# Patient Record
Sex: Female | Born: 1955 | Race: White | Hispanic: No | Marital: Married | Smoking: Former smoker
Health system: Southern US, Community
[De-identification: ages and names within clinical notes are randomized; demographics above are authoritative.]

## PROBLEM LIST (undated history)

## (undated) HISTORY — PX: CHOLECYSTECTOMY: SHX55

## (undated) HISTORY — PX: TUBAL LIGATION: SHX77

---

## 2006-08-04 ENCOUNTER — Ambulatory Visit: Payer: Self-pay | Admitting: Internal Medicine

## 2006-11-20 ENCOUNTER — Ambulatory Visit: Payer: Self-pay | Admitting: Internal Medicine

## 2007-06-29 ENCOUNTER — Ambulatory Visit: Payer: Self-pay | Admitting: Family Medicine

## 2007-07-07 ENCOUNTER — Ambulatory Visit: Payer: Self-pay | Admitting: Internal Medicine

## 2007-10-12 ENCOUNTER — Ambulatory Visit: Payer: Self-pay | Admitting: General Surgery

## 2007-10-14 ENCOUNTER — Ambulatory Visit: Payer: Self-pay | Admitting: General Surgery

## 2008-04-26 ENCOUNTER — Ambulatory Visit: Payer: Self-pay | Admitting: Internal Medicine

## 2008-09-29 IMAGING — CR DG KNEE COMPLETE 4+V*L*
1 series · 4 of 4 positions shown · non-contrast
Comparison: none

REASON FOR EXAM: pain
COMMENTS:

PROCEDURE:     DXR - DXR KNEE LT COMP WITH OBLIQUES  - June 29, 2007  [DATE]
RESULT:     Four views show no fracture, dislocation or other acute bony
abnormality. There is noted mild degenerative spurring about the knee
medially.  Also noted in the lateral view is slight dorsal patella spurring.

[Series 1: view not recorded · 0.17mm/px · 4 of 4 slices shown]
[im 1/4]
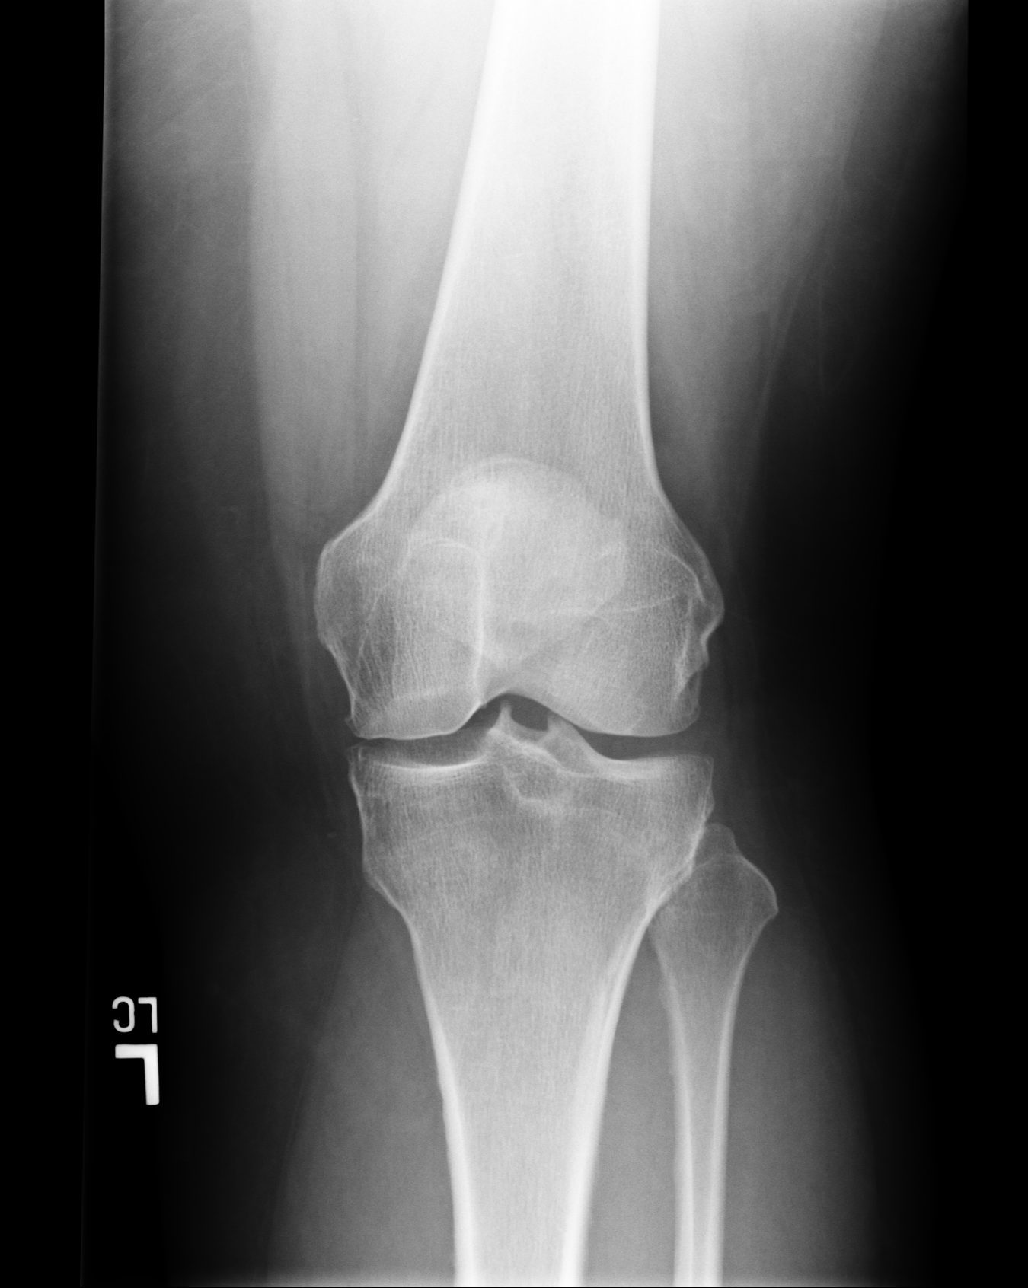
[im 2/4]
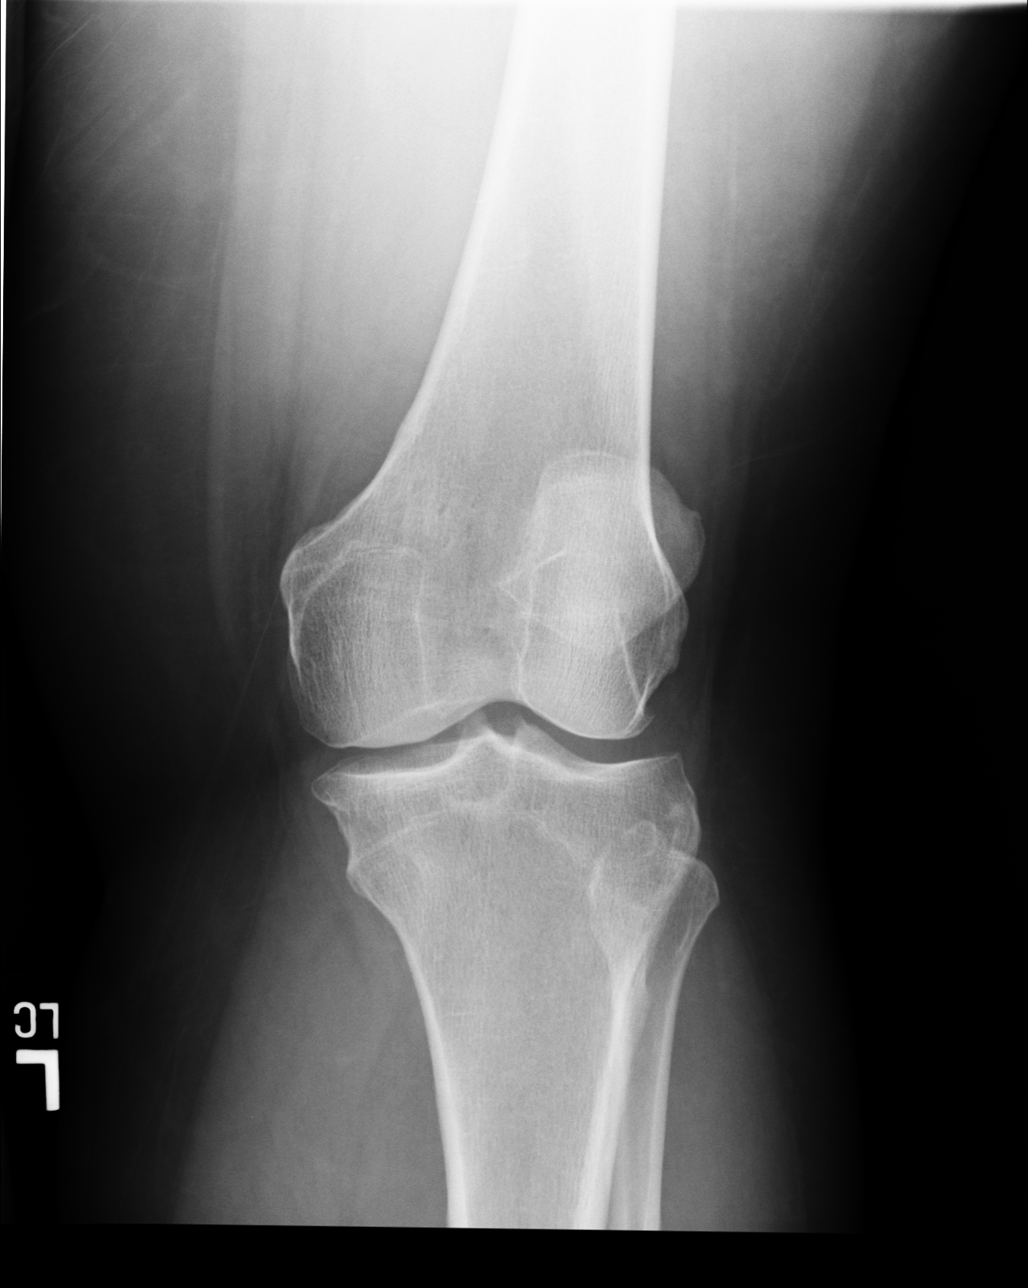
[im 3/4]
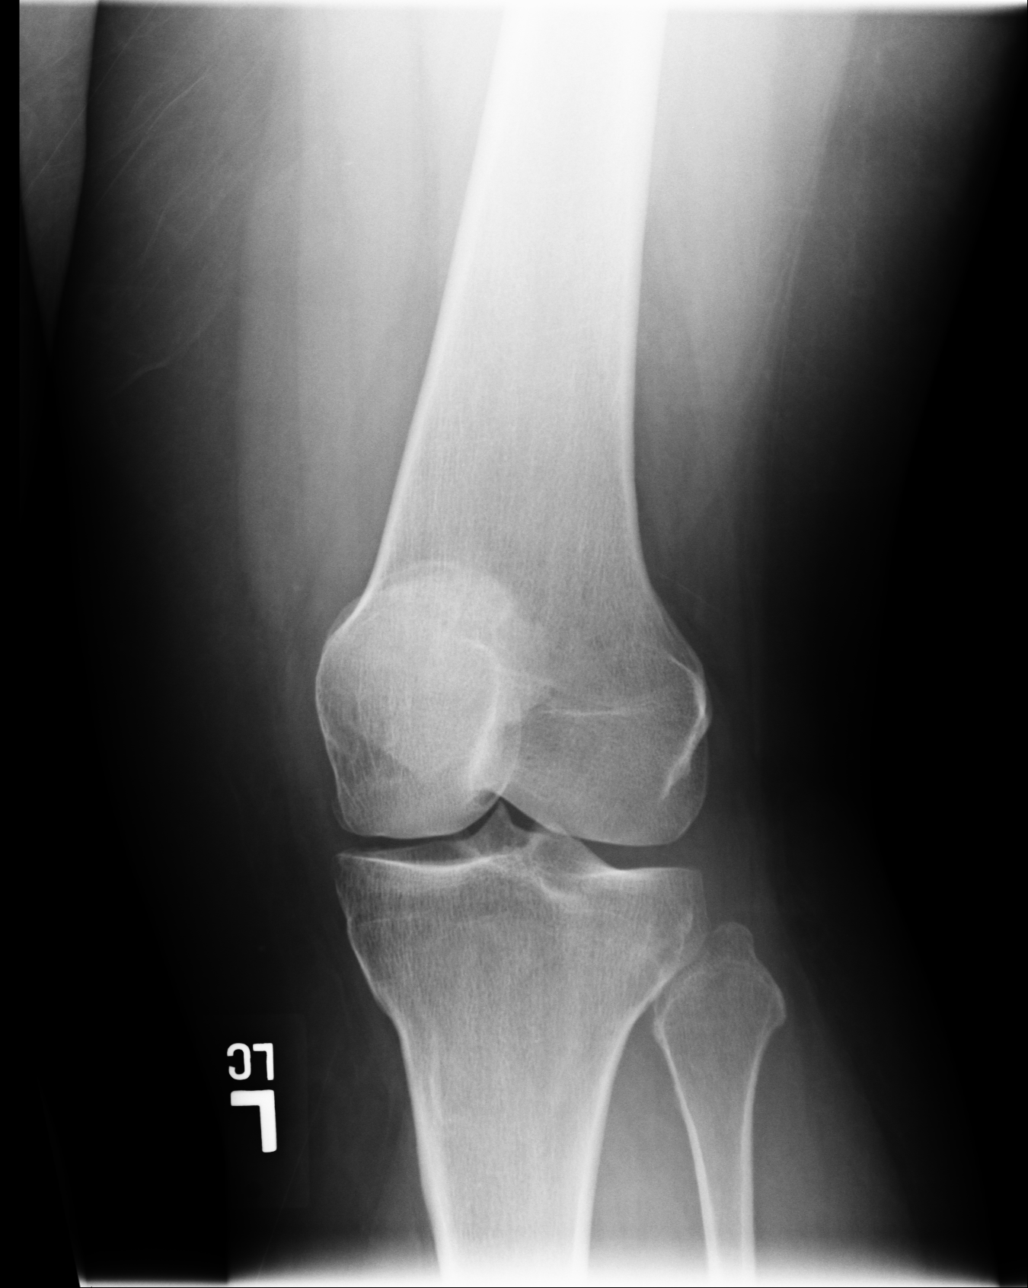
[im 4/4]
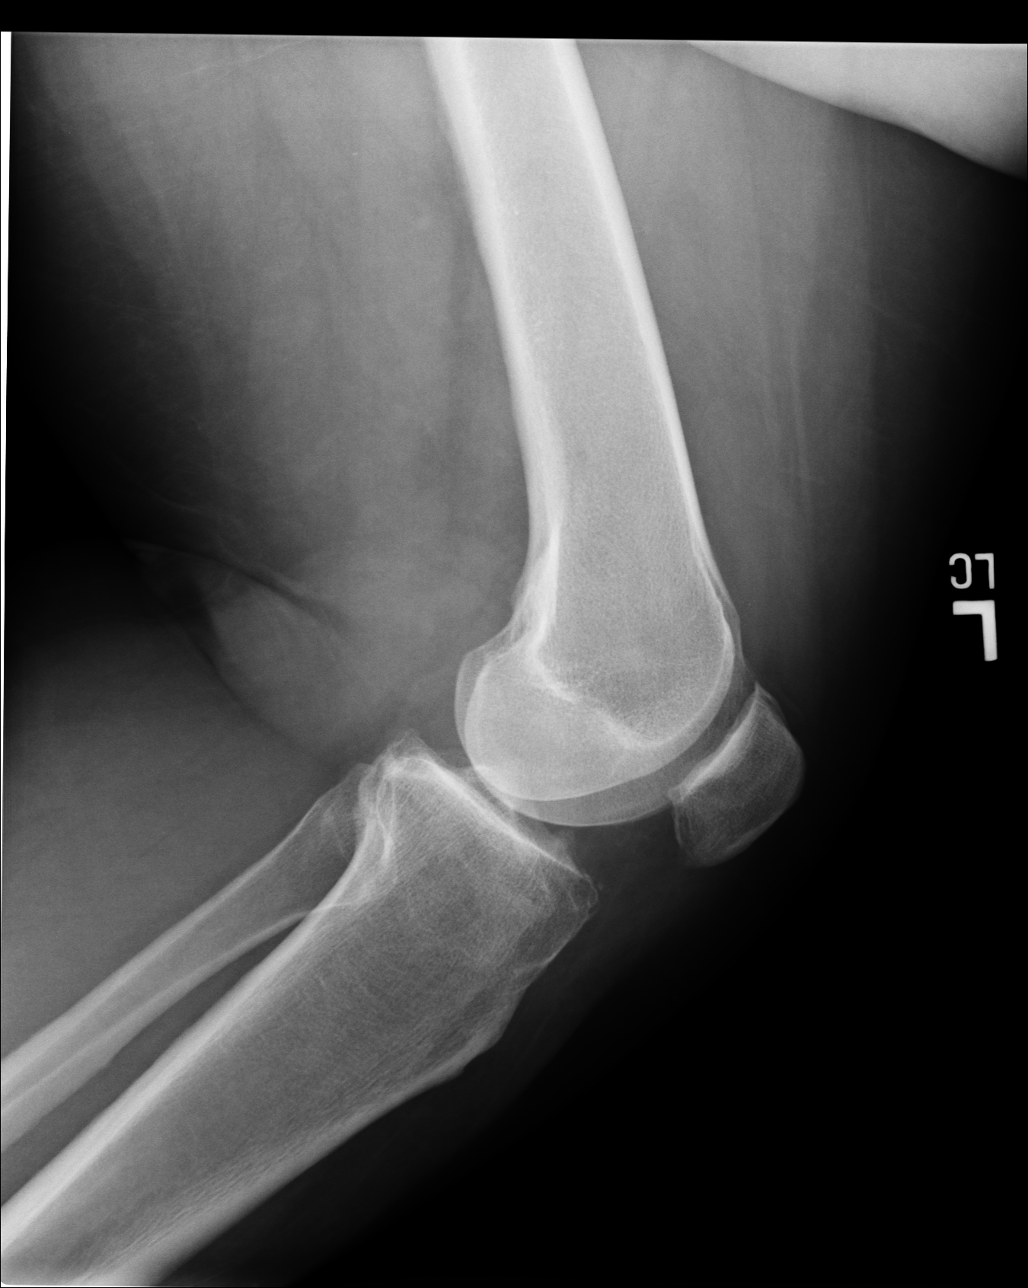

[4 of 4 positions shown; findings below may reference images not displayed]

IMPRESSION: 1. No fracture is seen.
2. Degenerative spurring is noted about the knee as noted above.

## 2008-11-03 ENCOUNTER — Ambulatory Visit: Payer: Self-pay | Admitting: Internal Medicine

## 2008-11-09 ENCOUNTER — Ambulatory Visit: Payer: Self-pay | Admitting: Internal Medicine

## 2009-06-05 ENCOUNTER — Ambulatory Visit: Payer: Self-pay | Admitting: Internal Medicine

## 2009-06-20 ENCOUNTER — Ambulatory Visit: Payer: Self-pay | Admitting: Internal Medicine

## 2010-10-16 ENCOUNTER — Ambulatory Visit: Payer: Self-pay | Admitting: Internal Medicine

## 2011-10-16 ENCOUNTER — Ambulatory Visit: Payer: Self-pay

## 2011-12-10 ENCOUNTER — Ambulatory Visit: Payer: Self-pay

## 2013-01-16 IMAGING — CR DG LUMBAR SPINE AP/LAT/OBLIQUES W/ FLEX AND EXT
1 series · 7 of 7 positions shown · non-contrast
Comparison: none

REASON FOR EXAM: pain
COMMENTS:

[Series 1: t lumbar spine ap · 0.14mm/px · 7 of 7 slices shown]
[im 1/7]
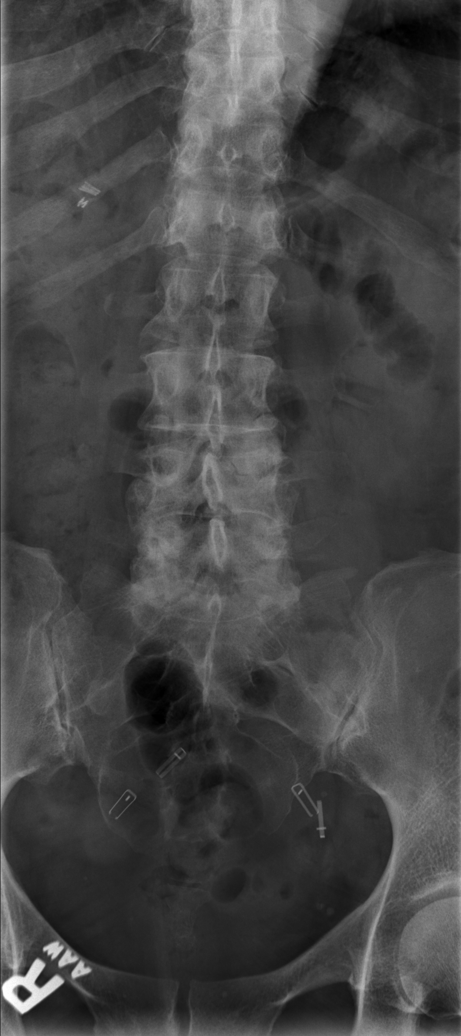
[im 2/7]
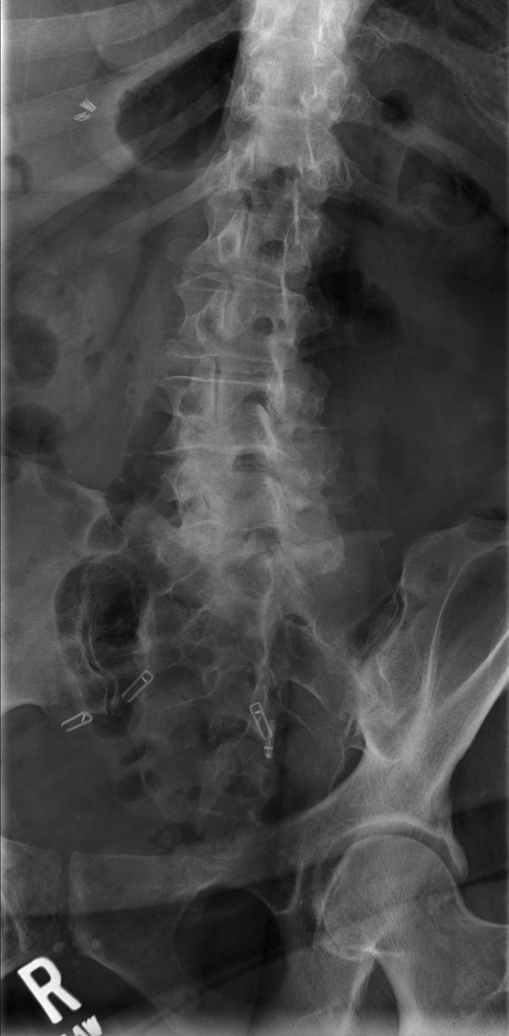
[im 3/7]
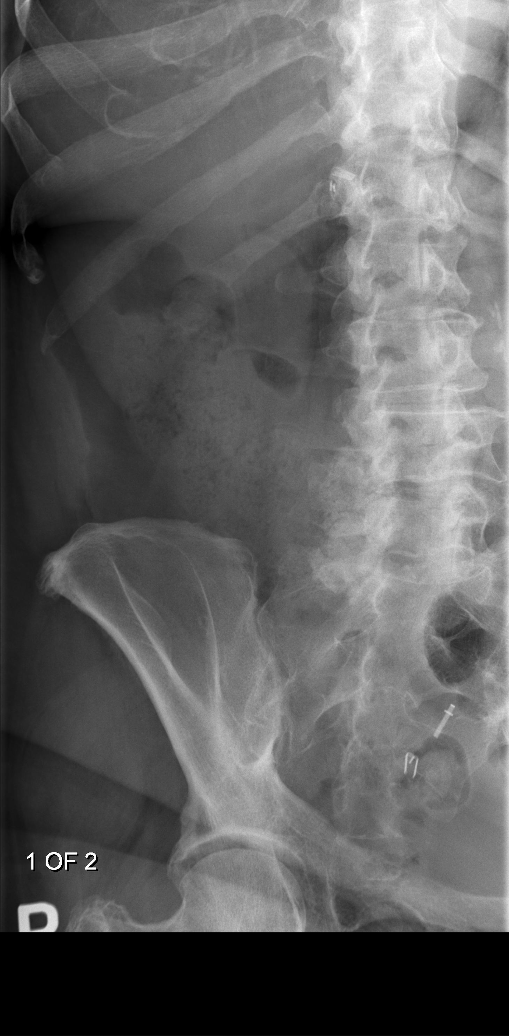
[im 4/7]
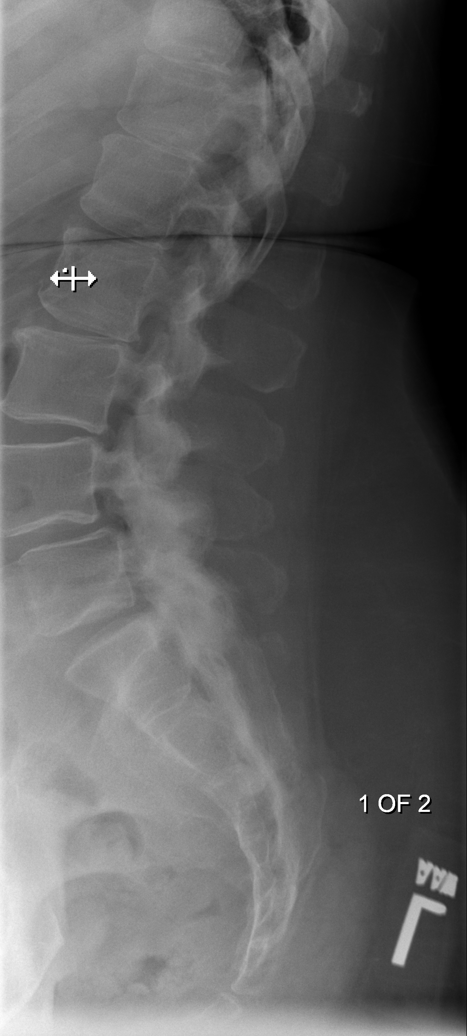
[im 5/7]
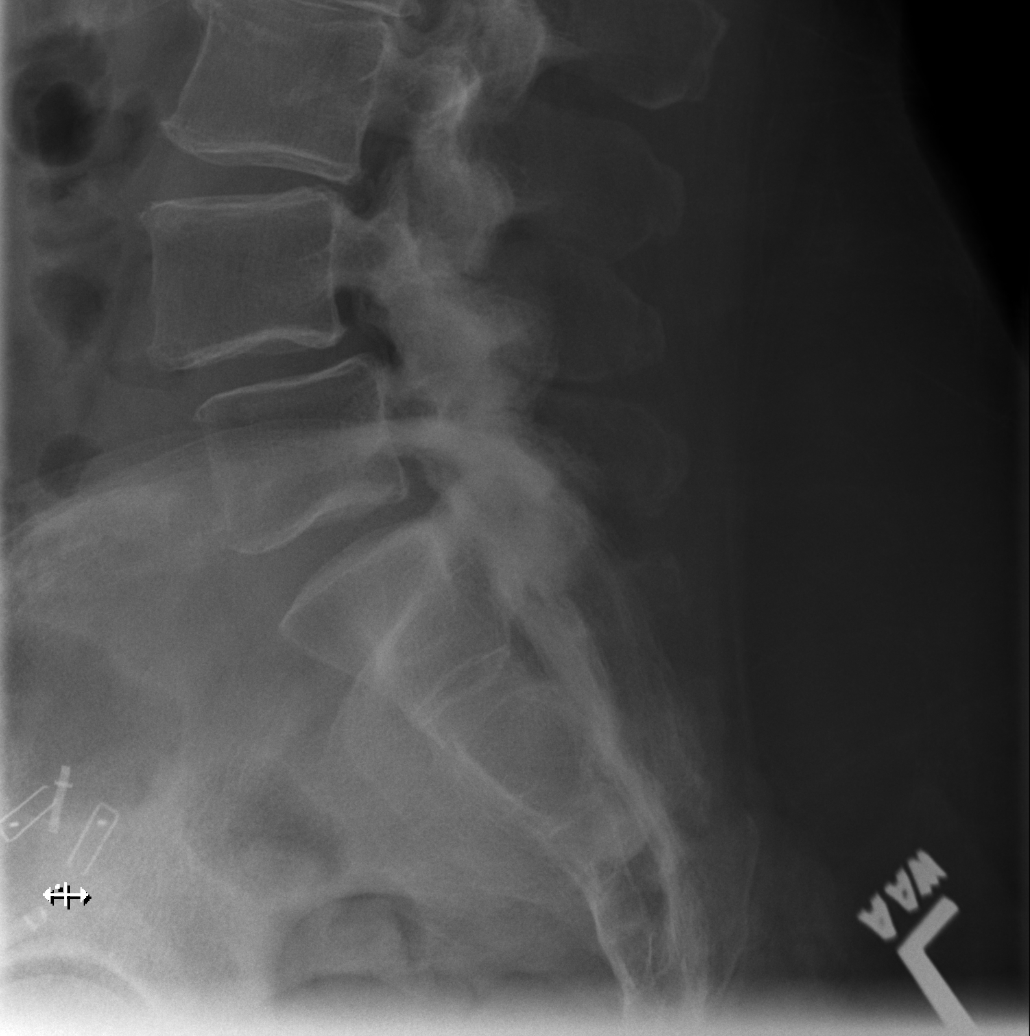
[im 6/7]
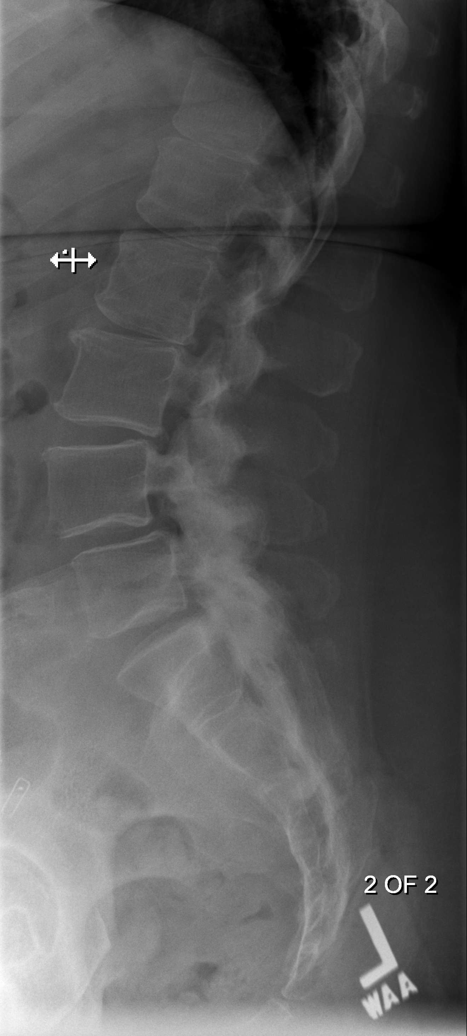
[im 7/7]
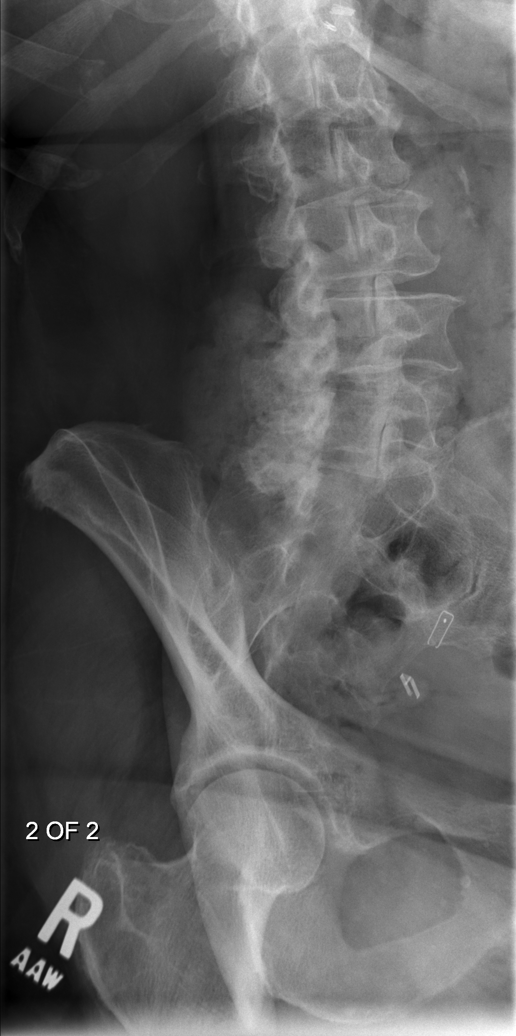

[7 of 7 positions shown; findings below may reference images not displayed]

PROCEDURE:     DXR - DXR LUMBAR SPINE WITH OBLIQUES  - October 16, 2011  [DATE]

RESULT:     Comparison is made to a prior exam of 06/29/2007. The vertebral
body heights are well-maintained. No fracture is seen. There is mild
narrowing of L4-L5 intervertebral disc space where there is also observed a
5 mm spondylolisthesis that does not appear appreciably changed as compared
to the prior exam. The intervertebral disc spaces otherwise are
well-maintained. The pedicles are bilaterally intact. No lytic or blastic
lesions are seen.
IMPRESSION: 1. No fracture is identified.
2. There is narrowing of the L4-L5 intervertebral disc space compatible with
disc disease and with there being an associated mild spondylolisthesis. The
changes at L4-L5 appear stable as compared to the exam of 06/29/2007.
[DATE]. No lytic or blastic lesions are seen.
4. The lumbar spine could be further evaluated by MR if clinically indicated.

## 2013-03-12 IMAGING — MG MAM DGTL SCRN MAM NO ORDER W/CAD
1 series · 4 of 4 positions shown · non-contrast
Comparison: none

REASON FOR EXAM: SCR MAMMO NO ORDER
COMMENTS:

PROCEDURE:     MAM - MAM DGTL SCRN MAM NO ORDER W/CAD  - December 10, 2011  [DATE]
RESULT:      No dominant masses or pathologic clustered calcifications are
demonstrated. Benign secretory calcifications are present.

[R CC · right · 4 of 4 slices shown]
[im 1/4]
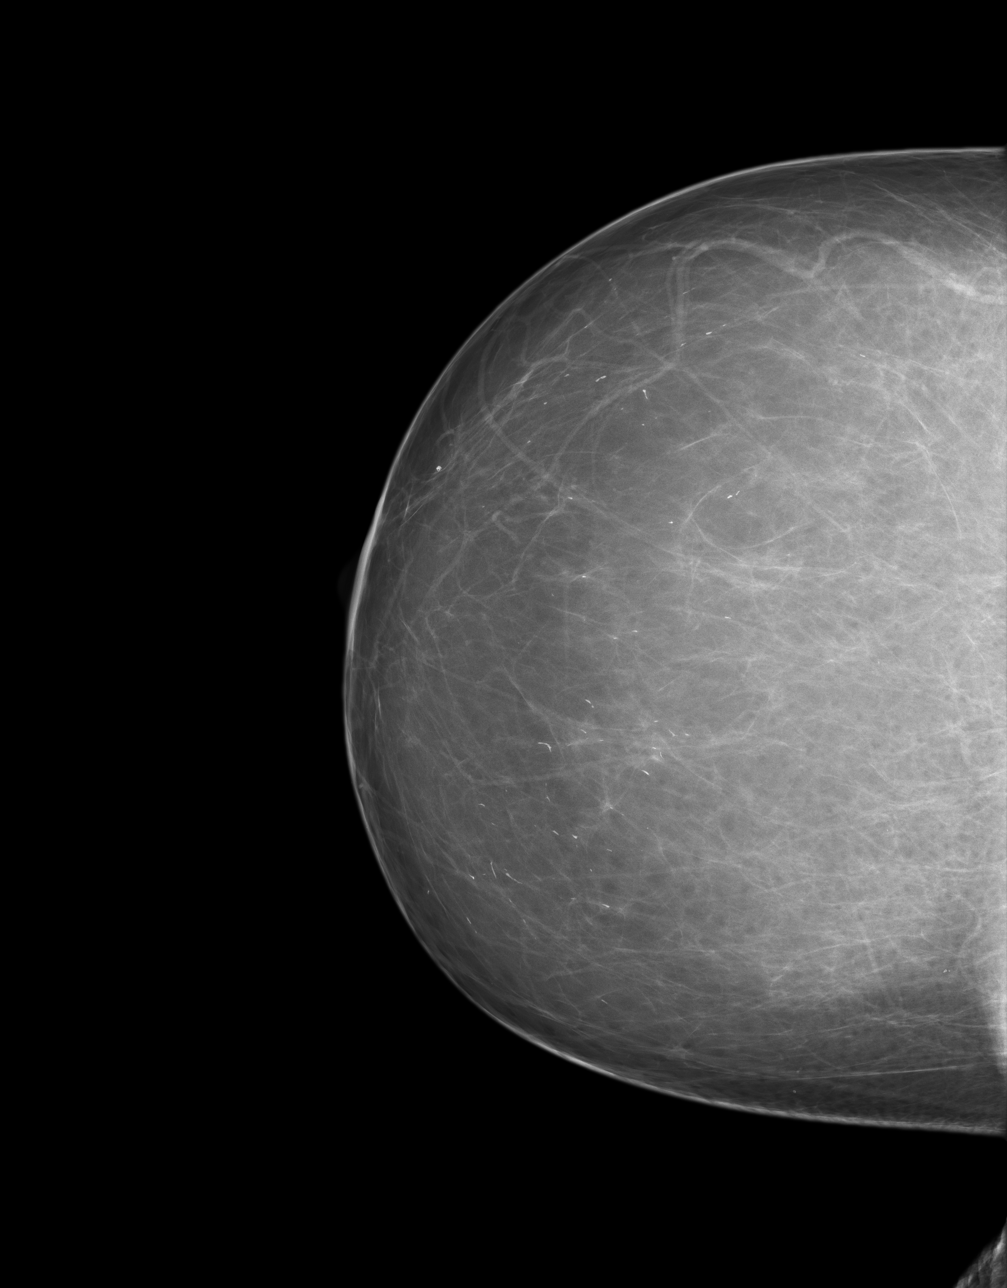
[im 2/4]
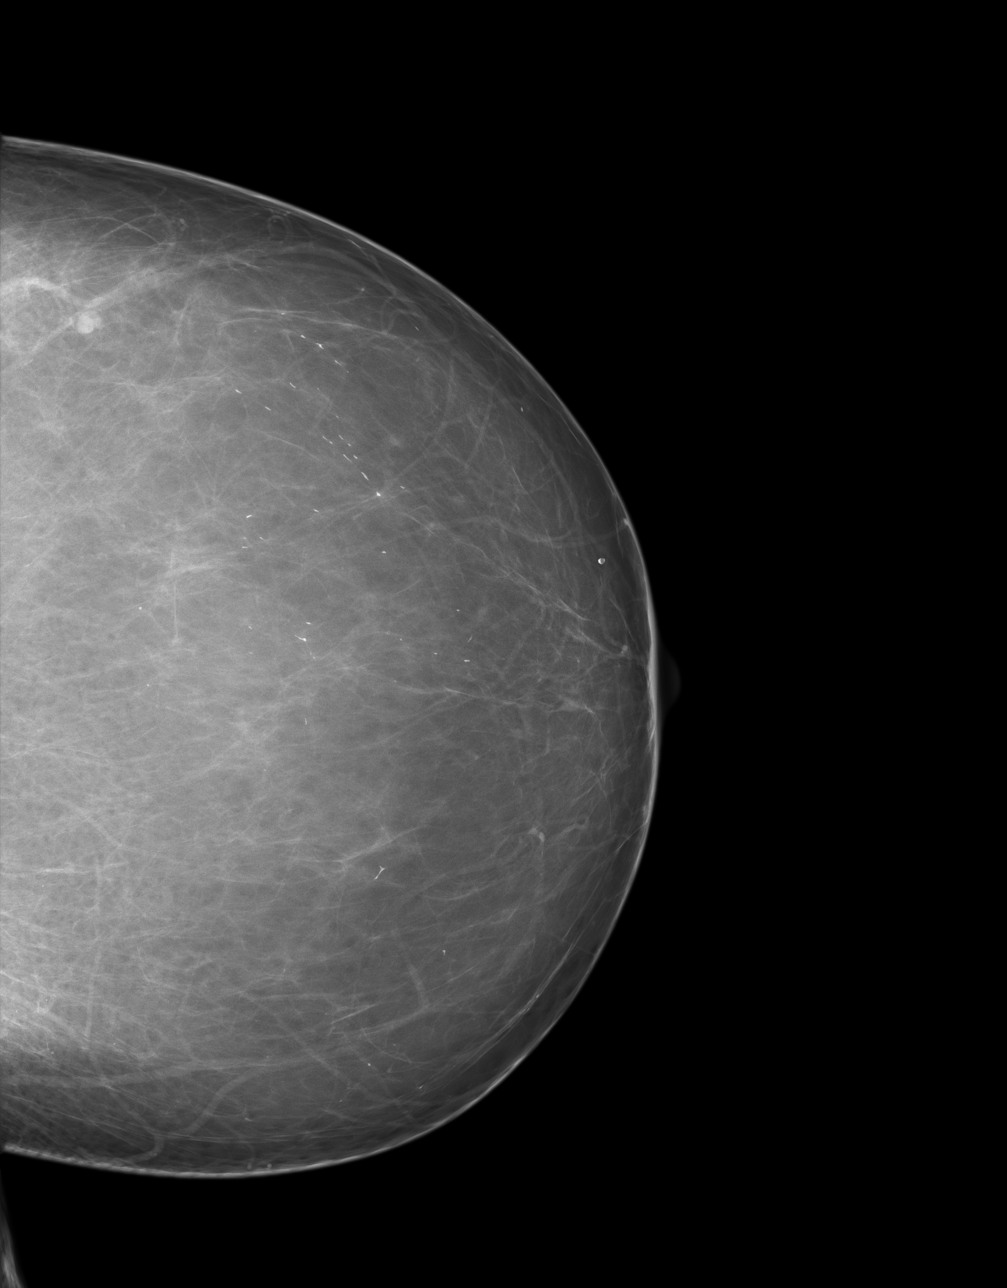
[im 3/4]
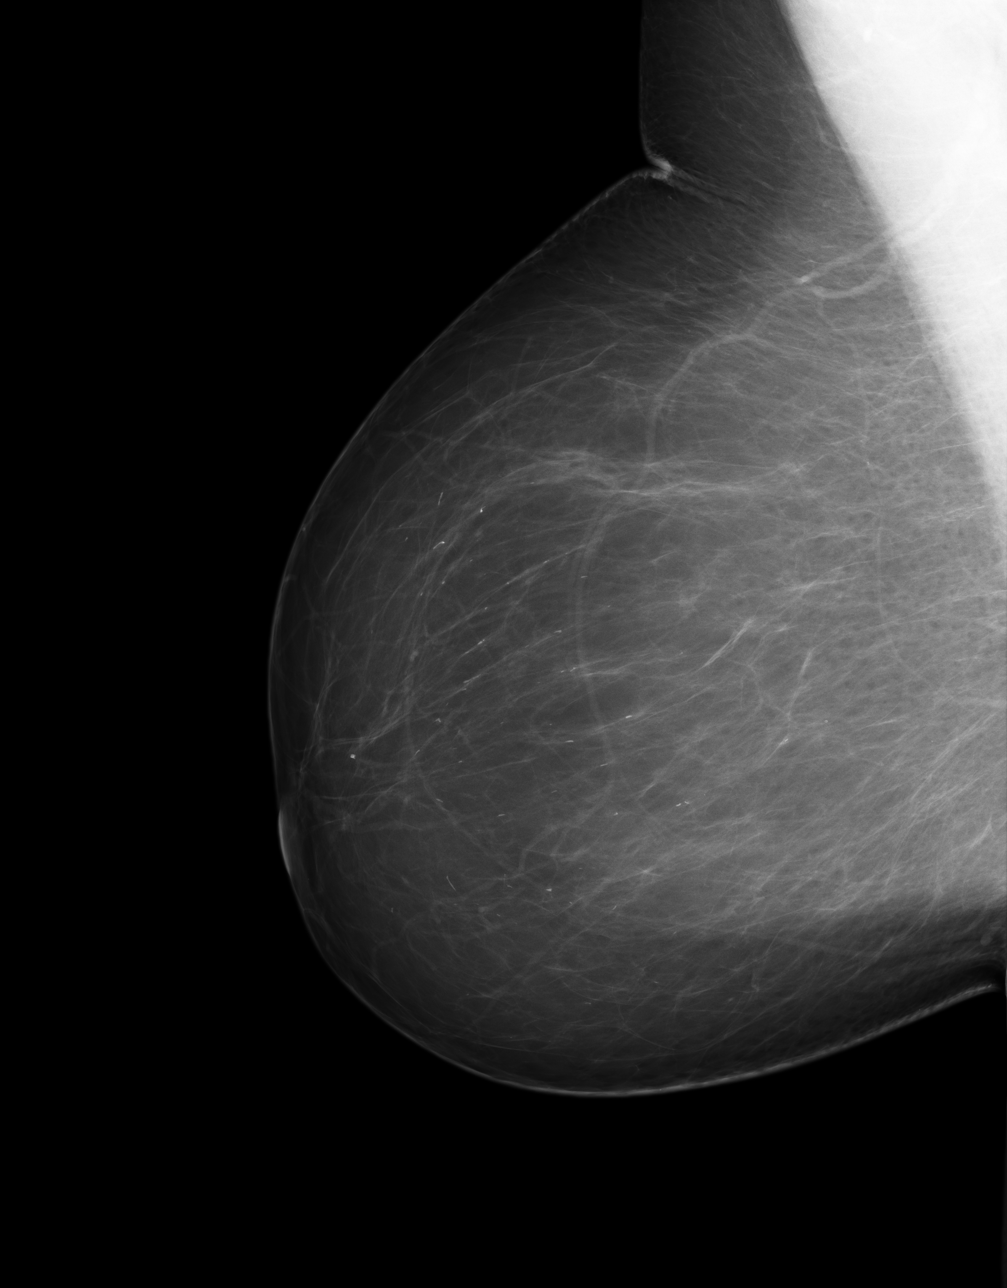
[im 4/4]
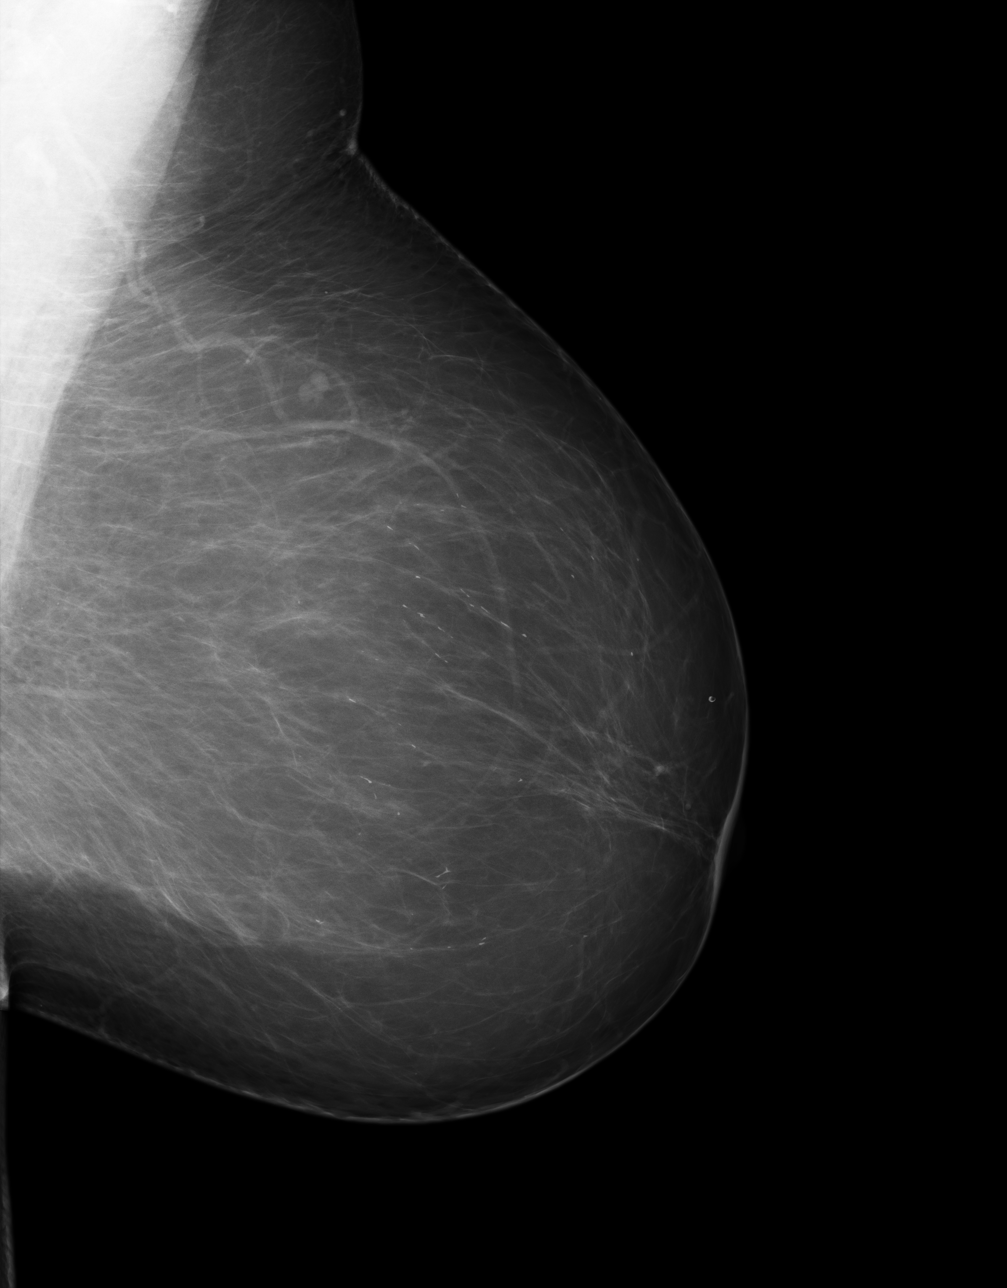

[4 of 4 positions shown; findings below may reference images not displayed]

IMPRESSION: 1.      Stable benign exam.
2.     Yearly follow up mammogram is suggested.
3.     BI-RADS 2:  Benign Findings.

A NEGATIVE MAMMOGRAM REPORT DOES NOT PRECLUDE BIOPSY OR OTHER EVALUATION OF
A CLINICALLY PALPABLE OR OTHERWISE SUSPICOUS MASS OR LESION.  BREAST CANCER
MAY NOT BE DETECTED BY MAMMOGRAPHY IN UP TO 10% OF CASES.

## 2014-04-16 ENCOUNTER — Emergency Department: Payer: Self-pay | Admitting: Emergency Medicine

## 2014-04-16 LAB — CBC WITH DIFFERENTIAL/PLATELET
BASOS ABS: 0 10*3/uL (ref 0.0–0.1)
BASOS PCT: 0.6 %
Eosinophil #: 0.1 10*3/uL (ref 0.0–0.7)
Eosinophil %: 1.5 %
HCT: 40.3 % (ref 35.0–47.0)
HGB: 13.5 g/dL (ref 12.0–16.0)
LYMPHS PCT: 22.2 %
Lymphocyte #: 1.3 10*3/uL (ref 1.0–3.6)
MCH: 29.1 pg (ref 26.0–34.0)
MCHC: 33.4 g/dL (ref 32.0–36.0)
MCV: 87 fL (ref 80–100)
Monocyte #: 0.4 x10 3/mm (ref 0.2–0.9)
Monocyte %: 6.1 %
NEUTROS PCT: 69.6 %
Neutrophil #: 4.1 10*3/uL (ref 1.4–6.5)
PLATELETS: 191 10*3/uL (ref 150–440)
RBC: 4.63 10*6/uL (ref 3.80–5.20)
RDW: 13.7 % (ref 11.5–14.5)
WBC: 5.9 10*3/uL (ref 3.6–11.0)

## 2014-04-16 LAB — COMPREHENSIVE METABOLIC PANEL
ANION GAP: 2 — AB (ref 7–16)
Albumin: 3.6 g/dL (ref 3.4–5.0)
Alkaline Phosphatase: 63 U/L
BUN: 9 mg/dL (ref 7–18)
Bilirubin,Total: 1 mg/dL (ref 0.2–1.0)
CHLORIDE: 106 mmol/L (ref 98–107)
CO2: 30 mmol/L (ref 21–32)
CREATININE: 0.77 mg/dL (ref 0.60–1.30)
Calcium, Total: 8.4 mg/dL — ABNORMAL LOW (ref 8.5–10.1)
EGFR (African American): >60
EGFR (Non-African Amer.): >60
GLUCOSE: 150 mg/dL — AB (ref 65–99)
Osmolality: 277 (ref 275–301)
Potassium: 4.1 mmol/L (ref 3.5–5.1)
SGOT(AST): 20 U/L (ref 15–37)
SGPT (ALT): 18 U/L (ref 12–78)
SODIUM: 138 mmol/L (ref 136–145)
Total Protein: 8 g/dL (ref 6.4–8.2)

## 2014-04-16 LAB — URINALYSIS, COMPLETE
Bacteria: NONE SEEN
Bilirubin,UR: NEGATIVE
Glucose,UR: NEGATIVE mg/dL (ref 0–75)
KETONE: NEGATIVE
NITRITE: NEGATIVE
Ph: 7 (ref 4.5–8.0)
Protein: NEGATIVE
RBC,UR: 1 /HPF (ref 0–5)
SPECIFIC GRAVITY: 1.011 (ref 1.003–1.030)
Squamous Epithelial: 1

## 2014-04-16 LAB — PRO B NATRIURETIC PEPTIDE: B-TYPE NATIURETIC PEPTID: 11 pg/mL (ref 0–125)

## 2015-08-24 ENCOUNTER — Other Ambulatory Visit: Payer: Self-pay | Admitting: Family Medicine

## 2015-08-24 DIAGNOSIS — Z1231 Encounter for screening mammogram for malignant neoplasm of breast: Secondary | ICD-10-CM

## 2017-01-24 ENCOUNTER — Ambulatory Visit
Admission: EM | Admit: 2017-01-24 | Discharge: 2017-01-24 | Disposition: A | Discharge status: Home / Self Care | Payer: 59 | Attending: Family Medicine | Admitting: Family Medicine

## 2017-01-24 ENCOUNTER — Encounter: Payer: Self-pay | Admitting: Emergency Medicine

## 2017-01-24 DIAGNOSIS — R509 Fever, unspecified: Secondary | ICD-10-CM

## 2017-01-24 DIAGNOSIS — J020 Streptococcal pharyngitis: Secondary | ICD-10-CM

## 2017-01-24 LAB — RAPID STREP SCREEN (MED CTR MEBANE ONLY): STREPTOCOCCUS, GROUP A SCREEN (DIRECT): POSITIVE — AB

## 2017-01-24 MED ORDER — DEXAMETHASONE SODIUM PHOSPHATE 10 MG/ML IJ SOLN
8.0000 mg | Freq: Once | INTRAMUSCULAR | Status: AC
  Administered 2017-01-24: 8 mg via INTRAMUSCULAR

## 2017-01-24 MED ORDER — ACETAMINOPHEN 325 MG PO TABS
650.0000 mg | ORAL_TABLET | Freq: Once | ORAL | Status: AC
  Administered 2017-01-24: 650 mg via ORAL

## 2017-01-24 MED ORDER — PENICILLIN G BENZATHINE 1200000 UNIT/2ML IM SUSP
1.2000 10*6.[IU] | Freq: Once | INTRAMUSCULAR | Status: AC
  Administered 2017-01-24: 1.2 10*6.[IU] via INTRAMUSCULAR

## 2017-01-24 NOTE — ED Triage Notes (Signed)
Patient c/o sore throat and fever, and bodyaches, and cough that started yesterday.

## 2017-01-24 NOTE — Discharge Instructions (Signed)
Rest. Drink plenty of fluids.  ° °Follow up with your primary care physician this week as needed. Return to Urgent care for new or worsening concerns.  ° °

## 2017-01-24 NOTE — ED Provider Notes (Signed)
[facility] ____________________________________________  Time seen: Approximately 10:17 AM  I have reviewed the triage vital signs and the nursing notes.   HISTORY  Chief Complaint Sore Throat and Fever  HPI Paige Cobb is a 61 y.o. female presents with husband at bedside for evaluation of sore throat, body aches and chills that started yesterday. Patient reports some nasal congestion and cough occasionally, but reports primarily sore throat. Patient reports she has a history of strep throat and her current feelings feel consistent with previous strep throat infections. Reports fever maximum 102 orally last night. Reports continues to drink fluids, decreased appetite. Reports has taken intermittent Tylenol and NyQuil at home. States no medications taken today. Reports some sick contacts at her work. Also reports she has grandchildren that live with her, but denies any sickness to grandchildren currently. States moderate sore throat at this time. Also reports some body aches currently.  Denies chest pain, shortness of breath, abdominal pain, dysuria, extremity pain, extremity swelling or rash. Denies recent sickness. Denies recent antibiotic use. Denies chronic medical problems. Denies daily medication use.   BLISS, Doreene Nest, MD: PCP   History reviewed. No pertinent past medical history.  There are no active problems to display for this patient.   Past Surgical History:  Procedure Laterality Date  . CHOLECYSTECTOMY    . TUBAL LIGATION       No current facility-administered medications for this encounter.  No current outpatient prescriptions on file.  Allergies Lodine [etodolac]  History reviewed. No pertinent family history.  Social History Social History  Substance Use Topics  . Smoking status: Former Games developer  . Smokeless tobacco: Never Used  . Alcohol use Yes    Review of Systems Constitutional: As above.  Eyes: No visual changes. ENT: AS above.    Cardiovascular: Denies chest pain. Respiratory: Denies shortness of breath. Gastrointestinal: No abdominal pain.  No nausea, no vomiting.  No diarrhea.  No constipation. Genitourinary: Negative for dysuria. Musculoskeletal: Negative for back pain. Skin: Negative for rash. Neurological: Negative for focal weakness or numbness.  10-point ROS otherwise negative.  ____________________________________________   PHYSICAL EXAM:  VITAL SIGNS: ED Triage Vitals  Enc Vitals Group     BP 01/24/17 0937 134/68     Pulse Rate 01/24/17 0937 94     Resp 01/24/17 0937 16     Temp 01/24/17 0937 100.2 F (37.9 C)     Temp Source 01/24/17 0937 Oral     SpO2 01/24/17 0937 99 %     Weight 01/24/17 0936 238 lb (108 kg)     Height 01/24/17 0936 5' 1.5" (1.562 m)     Head Circumference --      Peak Flow --      Pain Score 01/24/17 0939 8     Pain Loc --      Pain Edu? --      Excl. in GC? --     Constitutional: Alert and oriented. Well appearing and in no acute distress. Eyes: Conjunctivae are normal. PERRL. EOMI. Head: Atraumatic. No sinus tenderness to palpation. No swelling. No erythema.  Ears: no erythema, normal TMs bilaterally.   Nose: No rhinorrhea noted.  Mouth/Throat: Mucous membranes are moist. Moderate pharyngeal erythema, with bilateral tonsillar exudate. No uvular shift or deviation. Neck: No stridor.  No cervical spine tenderness to palpation. Hematological/Lymphatic/Immunilogical: Anterior bilateral cervical lymphadenopathy. Cardiovascular: Normal rate, regular rhythm. Grossly normal heart sounds.  Good peripheral circulation. Respiratory: Normal respiratory effort.  No retractions. No wheezes, rales  or rhonchi. Good air movement.  Gastrointestinal: Soft and nontender. No CVA tenderness. Musculoskeletal: Ambulatory with steady gait. No cervical, thoracic or lumbar tenderness to palpation. Neurologic:  Normal speech and language. No gait instability. Skin:  Skin appears warm,  dry and intact. No rash noted. Psychiatric: Mood and affect are normal. Speech and behavior are normal. ___________________________________________   LABS (all labs ordered are listed, but only abnormal results are displayed)  Labs Reviewed  RAPID STREP SCREEN (NOT AT [facility]) - Abnormal; Notable for the following:       Result Value   Streptococcus, Group A Screen (Direct) POSITIVE (*)    All other components within normal limits   PROCEDURES Procedures   INITIAL IMPRESSION / ASSESSMENT AND PLAN / ED COURSE  Pertinent labs & imaging results that were available during my care of the patient were reviewed by me and considered in my medical decision making (see chart for details).  Well-appearing patient. No acute distress. Quick strep positive. Discussed treatment options with patient and husband. Patient requested IM antibiotic. Will treat patient with Bicillin 1.2 million units once in urgent care, 8 mg IM Decadron and 650 mg Tylenol given once in urgent care. Discussed rest, supportive care, fluids. Work note given for today and tomorrow. Discussed strict follow-up and return parameters.  Discussed follow up with Primary care physician this week. Discussed follow up and return parameters including no resolution or any worsening concerns. Patient verbalized understanding and agreed to plan.   ____________________________________________   FINAL CLINICAL IMPRESSION(S) / ED DIAGNOSES  Final diagnoses:  Strep pharyngitis  Fever, unspecified fever cause     There are no discharge medications for this patient.   Note: This dictation was prepared with Dragon dictation along with smaller phrase technology. Any transcriptional errors that result from this process are unintentional.         Renford DillsLindsey Vedika Dumlao, NP 01/24/17 1343    Renford DillsLindsey Ommie Degeorge, NP 01/24/17 1344

## 2021-07-15 DIAGNOSIS — E559 Vitamin D deficiency, unspecified: Secondary | ICD-10-CM | POA: Diagnosis not present

## 2021-07-15 DIAGNOSIS — E119 Type 2 diabetes mellitus without complications: Secondary | ICD-10-CM | POA: Diagnosis not present

## 2021-07-15 DIAGNOSIS — F4322 Adjustment disorder with anxiety: Secondary | ICD-10-CM | POA: Diagnosis not present

## 2021-07-15 DIAGNOSIS — Z23 Encounter for immunization: Secondary | ICD-10-CM | POA: Diagnosis not present

## 2021-07-15 DIAGNOSIS — L603 Nail dystrophy: Secondary | ICD-10-CM | POA: Diagnosis not present

## 2021-07-15 DIAGNOSIS — Z6841 Body Mass Index (BMI) 40.0 and over, adult: Secondary | ICD-10-CM | POA: Diagnosis not present

## 2021-07-15 DIAGNOSIS — Z13220 Encounter for screening for lipoid disorders: Secondary | ICD-10-CM | POA: Diagnosis not present

## 2021-07-15 DIAGNOSIS — R7989 Other specified abnormal findings of blood chemistry: Secondary | ICD-10-CM | POA: Diagnosis not present

## 2021-07-15 DIAGNOSIS — R7309 Other abnormal glucose: Secondary | ICD-10-CM | POA: Diagnosis not present

## 2021-07-15 DIAGNOSIS — M1712 Unilateral primary osteoarthritis, left knee: Secondary | ICD-10-CM | POA: Diagnosis not present

## 2021-07-15 DIAGNOSIS — Z124 Encounter for screening for malignant neoplasm of cervix: Secondary | ICD-10-CM | POA: Diagnosis not present

## 2021-07-15 DIAGNOSIS — Z0001 Encounter for general adult medical examination with abnormal findings: Secondary | ICD-10-CM | POA: Diagnosis not present

## 2021-07-22 ENCOUNTER — Other Ambulatory Visit: Payer: Self-pay | Admitting: Family Medicine

## 2021-07-22 DIAGNOSIS — Z1231 Encounter for screening mammogram for malignant neoplasm of breast: Secondary | ICD-10-CM

## 2021-07-31 DIAGNOSIS — Z1211 Encounter for screening for malignant neoplasm of colon: Secondary | ICD-10-CM | POA: Diagnosis not present

## 2021-08-01 ENCOUNTER — Other Ambulatory Visit: Payer: Self-pay

## 2021-08-01 ENCOUNTER — Ambulatory Visit
Admission: RE | Admit: 2021-08-01 | Discharge: 2021-08-01 | Disposition: A | Discharge status: Home / Self Care | Payer: PPO | Source: Ambulatory Visit | Attending: Family Medicine | Admitting: Family Medicine

## 2021-08-01 DIAGNOSIS — Z1231 Encounter for screening mammogram for malignant neoplasm of breast: Secondary | ICD-10-CM | POA: Diagnosis not present

## 2021-10-16 DIAGNOSIS — Z23 Encounter for immunization: Secondary | ICD-10-CM | POA: Diagnosis not present

## 2021-10-16 DIAGNOSIS — E119 Type 2 diabetes mellitus without complications: Secondary | ICD-10-CM | POA: Diagnosis not present

## 2021-10-16 DIAGNOSIS — M545 Low back pain, unspecified: Secondary | ICD-10-CM | POA: Diagnosis not present

## 2021-10-16 DIAGNOSIS — Z6841 Body Mass Index (BMI) 40.0 and over, adult: Secondary | ICD-10-CM | POA: Diagnosis not present

## 2022-07-18 ENCOUNTER — Other Ambulatory Visit: Payer: Self-pay | Admitting: Family Medicine

## 2022-07-18 DIAGNOSIS — Z1231 Encounter for screening mammogram for malignant neoplasm of breast: Secondary | ICD-10-CM

## 2022-08-05 ENCOUNTER — Ambulatory Visit
Admission: RE | Admit: 2022-08-05 | Discharge: 2022-08-05 | Disposition: A | Discharge status: Home / Self Care | Payer: PPO | Source: Ambulatory Visit | Attending: Family Medicine | Admitting: Family Medicine

## 2022-08-05 DIAGNOSIS — Z1231 Encounter for screening mammogram for malignant neoplasm of breast: Secondary | ICD-10-CM | POA: Diagnosis present

## 2023-04-20 DIAGNOSIS — E119 Type 2 diabetes mellitus without complications: Secondary | ICD-10-CM | POA: Diagnosis not present

## 2023-04-20 DIAGNOSIS — Z6839 Body mass index (BMI) 39.0-39.9, adult: Secondary | ICD-10-CM | POA: Diagnosis not present

## 2023-04-20 DIAGNOSIS — K219 Gastro-esophageal reflux disease without esophagitis: Secondary | ICD-10-CM | POA: Diagnosis not present

## 2023-07-17 ENCOUNTER — Ambulatory Visit
Admission: EM | Admit: 2023-07-17 | Discharge: 2023-07-17 | Disposition: A | Discharge status: Home / Self Care | Payer: PPO | Attending: Emergency Medicine | Admitting: Emergency Medicine

## 2023-07-17 DIAGNOSIS — J0140 Acute pansinusitis, unspecified: Secondary | ICD-10-CM

## 2023-07-17 MED ORDER — AZITHROMYCIN 250 MG PO TABS: 250.0000 mg | ORAL_TABLET | Freq: Every day | ORAL | 0 refills | Status: AC

## 2023-07-17 MED ORDER — PREDNISONE 20 MG PO TABS: 40.0000 mg | ORAL_TABLET | Freq: Every day | ORAL | 0 refills | Status: AC

## 2023-07-17 MED ORDER — IPRATROPIUM BROMIDE 0.03 % NA SOLN: 2.0000 | Freq: Two times a day (BID) | NASAL | 12 refills | Status: AC

## 2023-07-17 NOTE — Discharge Instructions (Signed)
Your symptoms today are most likely being caused by a virus and should steadily improve in time it can take up to 7 to 10 days before you truly start to see a turnaround however things will get better  If no improvement seen by day 7, you may start azithromycin to cover for bacteria, available at the [facility] on Tuesday   In the meantime, begin prednisone every morning for 5 days, helps to reduce inflammation  Begin use of nasal spray every morning and every evening, helps to clear congestion from sinuses      You can take Tylenol and/or Ibuprofen as needed for fever reduction and pain relief.   For cough: honey 1/2 to 1 teaspoon (you can dilute the honey in water or another fluid).  You can also use guaifenesin and dextromethorphan for cough. You can use a humidifier for chest congestion and cough.  If you don't have a humidifier, you can sit in the bathroom with the hot shower running.      For sore throat: try warm salt water gargles, cepacol lozenges, throat spray, warm tea or water with lemon/honey, popsicles or ice, or OTC cold relief medicine for throat discomfort.   For congestion: take a daily anti-histamine like Zyrtec, Claritin, and a oral decongestant, such as pseudoephedrine.  You can also use Flonase 1-2 sprays in each nostril daily.   It is important to stay hydrated: drink plenty of fluids (water, gatorade/powerade/pedialyte, juices, or teas) to keep your throat moisturized and help further relieve irritation/discomfort.

## 2023-07-17 NOTE — ED Provider Notes (Addendum)
[facility]    CSN: [id number] Arrival date & time: 07/17/23  1155      History   Chief Complaint Chief Complaint  Patient presents with   Facial Pain    HPI Paige Cobb is a 67 y.o. female.   Patient presents for chills, body aches, congestion, rhinorrhea, sinus pressure and pain, sore throat, bilateral ear pain and a productive cough for 2 days. Drainage green in the morning, changes to clear as the day progressives, sinus pressure and pain generalized to the face. Sick contact in household. Using sudafed and alka seltzer cold and flu. Denies fever, sob, wheezing.     History reviewed. No pertinent past medical history.  There are no problems to display for this patient.   Past Surgical History:  Procedure Laterality Date   CHOLECYSTECTOMY     TUBAL LIGATION      OB History   No obstetric history on file.      Home Medications    Prior to Admission medications   Not on File    Family History Family History  Problem Relation Age of Onset   Breast cancer Paternal Aunt     Social History Social History   Tobacco Use   Smoking status: Former   Smokeless tobacco: Never  Substance Use Topics   Alcohol use: Yes     Allergies   Lodine [etodolac]   Review of Systems Review of Systems  Constitutional:  Positive for chills. Negative for activity change, appetite change, diaphoresis, fatigue, fever and unexpected weight change.  HENT:  Positive for congestion, ear pain, rhinorrhea, sinus pressure, sinus pain and sore throat. Negative for dental problem, drooling, ear discharge, facial swelling, hearing loss, mouth sores, nosebleeds, postnasal drip, sneezing, tinnitus, trouble swallowing and voice change.   Respiratory:  Positive for cough. Negative for apnea, choking, chest tightness, shortness of breath, wheezing and stridor.   Cardiovascular: Negative.   Gastrointestinal: Negative.   Musculoskeletal:  Positive for myalgias. Negative for  arthralgias, back pain, gait problem, joint swelling, neck pain and neck stiffness.  Skin: Negative.      Physical Exam Triage Vital Signs ED Triage Vitals  Encounter Vitals Group     BP 07/17/23 1220 121/68     Systolic BP Percentile --      Diastolic BP Percentile --      Pulse Rate 07/17/23 1220 78     Resp 07/17/23 1220 18     Temp 07/17/23 1220 98.9 F (37.2 C)     Temp Source 07/17/23 1220 Oral     SpO2 07/17/23 1220 96 %     Weight --      Height --      Head Circumference --      Peak Flow --      Pain Score 07/17/23 1219 5     Pain Loc --      Pain Education --      Exclude from Growth Chart --    No data found.  Updated Vital Signs BP 121/68 (BP Location: Left Arm)   Pulse 78   Temp 98.9 F (37.2 C) (Oral)   Resp 18   SpO2 96%   Visual Acuity Right Eye Distance:   Left Eye Distance:   Bilateral Distance:    Right Eye Near:   Left Eye Near:    Bilateral Near:     Physical Exam Constitutional:      Appearance: Normal appearance.  HENT:  Head: Normocephalic.     Right Ear: Tympanic membrane, ear canal and external ear normal.     Left Ear: Tympanic membrane, ear canal and external ear normal.     Nose: Congestion present. No rhinorrhea.     Right Sinus: Maxillary sinus tenderness and frontal sinus tenderness present.     Left Sinus: Maxillary sinus tenderness and frontal sinus tenderness present.     Mouth/Throat:     Mouth: Mucous membranes are moist.     Pharynx: Posterior oropharyngeal erythema present.  Cardiovascular:     Rate and Rhythm: Normal rate and regular rhythm.     Pulses: Normal pulses.     Heart sounds: Normal heart sounds.  Pulmonary:     Effort: Pulmonary effort is normal.     Breath sounds: Normal breath sounds.  Neurological:     Mental Status: She is alert.     UC Treatments / Results  Labs (all labs ordered are listed, but only abnormal results are displayed) Labs Reviewed - No data to  display  EKG   Radiology No results found.  Procedures Procedures (including critical care time)  Medications Ordered in UC Medications - No data to display  Initial Impression / Assessment and Plan / UC Course  I have reviewed the triage vital signs and the nursing notes.  Pertinent labs & imaging results that were available during my care of the patient were reviewed by me and considered in my medical decision making (see chart for details).  Acute non recurrent pansinusitis   Patient is in no signs of distress nor toxic appearing.  Vital signs are stable.  Low suspicion for pneumonia, pneumothorax or bronchitis and therefore will defer imaging. Prescribed prednisone and Atrovent nasal spray, watch wait antibiotic placed at pharmacy. May use additional over-the-counter medications as needed for supportive care.  May follow-up with urgent care as needed if symptoms persist or worsen.    Final Clinical Impressions(s) / UC Diagnoses   Final diagnoses:  None   Discharge Instructions   None    ED Prescriptions   None    PDMP not reviewed this encounter.   Valinda Hoar, NP 07/17/23 1244    Valinda Hoar, NP 07/17/23 1244

## 2023-07-17 NOTE — ED Triage Notes (Signed)
Pt present facial pain with nasal drainage and cough, symptoms started two days ago. Pt states mucous is green and has turned clear as the day comes along.

## 2023-07-23 DIAGNOSIS — U071 COVID-19: Secondary | ICD-10-CM | POA: Diagnosis not present

## 2023-07-31 ENCOUNTER — Other Ambulatory Visit: Payer: Self-pay | Admitting: Family Medicine

## 2023-07-31 DIAGNOSIS — Z1231 Encounter for screening mammogram for malignant neoplasm of breast: Secondary | ICD-10-CM

## 2023-08-18 DIAGNOSIS — E119 Type 2 diabetes mellitus without complications: Secondary | ICD-10-CM | POA: Diagnosis not present

## 2023-08-18 DIAGNOSIS — Z7189 Other specified counseling: Secondary | ICD-10-CM | POA: Diagnosis not present

## 2023-08-18 DIAGNOSIS — E782 Mixed hyperlipidemia: Secondary | ICD-10-CM | POA: Diagnosis not present

## 2023-08-18 DIAGNOSIS — Z6838 Body mass index (BMI) 38.0-38.9, adult: Secondary | ICD-10-CM | POA: Diagnosis not present

## 2023-08-18 DIAGNOSIS — Z79899 Other long term (current) drug therapy: Secondary | ICD-10-CM | POA: Diagnosis not present

## 2023-08-18 DIAGNOSIS — Z0001 Encounter for general adult medical examination with abnormal findings: Secondary | ICD-10-CM | POA: Diagnosis not present

## 2023-08-18 DIAGNOSIS — Z1331 Encounter for screening for depression: Secondary | ICD-10-CM | POA: Diagnosis not present

## 2023-08-18 DIAGNOSIS — Z1389 Encounter for screening for other disorder: Secondary | ICD-10-CM | POA: Diagnosis not present

## 2023-08-18 DIAGNOSIS — K219 Gastro-esophageal reflux disease without esophagitis: Secondary | ICD-10-CM | POA: Diagnosis not present

## 2023-08-26 ENCOUNTER — Ambulatory Visit
Admission: RE | Admit: 2023-08-26 | Discharge: 2023-08-26 | Disposition: A | Discharge status: Home / Self Care | Payer: PPO | Source: Ambulatory Visit | Attending: Family Medicine | Admitting: Family Medicine

## 2023-08-26 DIAGNOSIS — Z1231 Encounter for screening mammogram for malignant neoplasm of breast: Secondary | ICD-10-CM | POA: Diagnosis not present

## 2023-11-19 DIAGNOSIS — Z23 Encounter for immunization: Secondary | ICD-10-CM | POA: Diagnosis not present

## 2024-08-24 ENCOUNTER — Other Ambulatory Visit: Payer: Self-pay | Admitting: Family Medicine

## 2024-08-24 DIAGNOSIS — Z1231 Encounter for screening mammogram for malignant neoplasm of breast: Secondary | ICD-10-CM

## 2024-08-31 ENCOUNTER — Ambulatory Visit
Admission: RE | Admit: 2024-08-31 | Discharge: 2024-08-31 | Disposition: A | Discharge status: Home / Self Care | Source: Ambulatory Visit | Attending: Family Medicine | Admitting: Family Medicine

## 2024-08-31 DIAGNOSIS — Z1231 Encounter for screening mammogram for malignant neoplasm of breast: Secondary | ICD-10-CM | POA: Diagnosis present
